# Patient Record
Sex: Female | Born: 1995 | Race: Black or African American | Hispanic: No | Marital: Single | Smoking: Never smoker
Health system: Southern US, Community
[De-identification: ages and names within clinical notes are randomized; demographics above are authoritative.]

## PROBLEM LIST (undated history)

## (undated) DIAGNOSIS — A64 Unspecified sexually transmitted disease: Secondary | ICD-10-CM

---

## 2016-02-13 DIAGNOSIS — A64 Unspecified sexually transmitted disease: Secondary | ICD-10-CM

## 2016-02-13 HISTORY — DX: Unspecified sexually transmitted disease: A64

## 2017-05-25 ENCOUNTER — Emergency Department (HOSPITAL_COMMUNITY)
Admission: EM | Admit: 2017-05-25 | Discharge: 2017-05-26 | Disposition: A | Discharge status: Home / Self Care | Payer: Self-pay | Attending: Emergency Medicine | Admitting: Emergency Medicine

## 2017-05-25 ENCOUNTER — Encounter (HOSPITAL_COMMUNITY): Payer: Self-pay | Admitting: Emergency Medicine

## 2017-05-25 DIAGNOSIS — Z331 Pregnant state, incidental: Secondary | ICD-10-CM | POA: Insufficient documentation

## 2017-05-25 DIAGNOSIS — N9489 Other specified conditions associated with female genital organs and menstrual cycle: Secondary | ICD-10-CM | POA: Insufficient documentation

## 2017-05-25 DIAGNOSIS — R112 Nausea with vomiting, unspecified: Secondary | ICD-10-CM | POA: Insufficient documentation

## 2017-05-25 HISTORY — DX: Unspecified sexually transmitted disease: A64

## 2017-05-25 LAB — CBC
HCT: 34.7 % — ABNORMAL LOW (ref 36.0–46.0)
Hemoglobin: 12.3 g/dL (ref 12.0–15.0)
MCH: 29.6 pg (ref 26.0–34.0)
MCHC: 35.4 g/dL (ref 30.0–36.0)
MCV: 83.6 fL (ref 78.0–100.0)
Platelets: 203 10*3/uL (ref 150–400)
RBC: 4.15 MIL/uL (ref 3.87–5.11)
RDW: 12.5 % (ref 11.5–15.5)
WBC: 6.8 10*3/uL (ref 4.0–10.5)

## 2017-05-25 LAB — POC URINE PREG, ED: Preg Test, Ur: POSITIVE — AB

## 2017-05-25 LAB — COMPREHENSIVE METABOLIC PANEL
ALBUMIN: 4.5 g/dL (ref 3.5–5.0)
ALK PHOS: 48 U/L (ref 38–126)
ALT: 15 U/L (ref 14–54)
AST: 33 U/L (ref 15–41)
Anion gap: 5 (ref 5–15)
BILIRUBIN TOTAL: 0.3 mg/dL (ref 0.3–1.2)
BUN: 10 mg/dL (ref 6–20)
CALCIUM: 9.4 mg/dL (ref 8.9–10.3)
CO2: 25 mmol/L (ref 22–32)
CREATININE: 0.54 mg/dL (ref 0.44–1.00)
Chloride: 103 mmol/L (ref 101–111)
GFR calc Af Amer: >60 mL/min (ref >60)
GFR calc non Af Amer: >60 mL/min (ref >60)
GLUCOSE: 88 mg/dL (ref 65–99)
Potassium: 3.8 mmol/L (ref 3.5–5.1)
Sodium: 133 mmol/L — ABNORMAL LOW (ref 135–145)
Total Protein: 8.7 g/dL — ABNORMAL HIGH (ref 6.5–8.1)

## 2017-05-25 LAB — URINALYSIS, ROUTINE W REFLEX MICROSCOPIC
Bilirubin Urine: NEGATIVE
GLUCOSE, UA: NEGATIVE mg/dL
Hgb urine dipstick: NEGATIVE
Ketones, ur: 80 mg/dL — AB
NITRITE: NEGATIVE
PH: 5 (ref 5.0–8.0)
PROTEIN: NEGATIVE mg/dL
Specific Gravity, Urine: 1.023 (ref 1.005–1.030)

## 2017-05-25 LAB — LIPASE, BLOOD: Lipase: 29 U/L (ref 11–51)

## 2017-05-25 MED ORDER — DIPHENHYDRAMINE HCL 50 MG/ML IJ SOLN
25.0000 mg | Freq: Once | INTRAMUSCULAR | Status: AC
  Administered 2017-05-25: 25 mg via INTRAVENOUS
  Filled 2017-05-25: qty 1

## 2017-05-25 MED ORDER — ONDANSETRON 4 MG PO TBDP: 4.0000 mg | ORAL_TABLET | Freq: Once | ORAL | Status: DC | PRN

## 2017-05-25 MED ORDER — SODIUM CHLORIDE 0.9 % IV BOLUS (SEPSIS)
1000.0000 mL | Freq: Once | INTRAVENOUS | Status: AC
  Administered 2017-05-25: 1000 mL via INTRAVENOUS

## 2017-05-25 MED ORDER — METOCLOPRAMIDE HCL 5 MG/ML IJ SOLN
10.0000 mg | Freq: Once | INTRAMUSCULAR | Status: AC
  Administered 2017-05-25: 10 mg via INTRAVENOUS

## 2017-05-25 MED ORDER — METOCLOPRAMIDE HCL 5 MG/ML IJ SOLN
INTRAMUSCULAR | Status: AC
  Administered 2017-05-25: 10 mg via INTRAVENOUS
  Filled 2017-05-25: qty 2

## 2017-05-25 NOTE — ED Provider Notes (Signed)
[facility] DE[facility] Provider Note: Lowella Dell, MD, FACEP  CSN: [id number] MRN: [id number] ARRIVAL: 05/25/17 at 2014 ROOM: WA06/WA06   CHIEF COMPLAINT  Vomiting   HISTORY OF PRESENT ILLNESS  Tonya Berger is a 21 y.o. female with a one-week history of intermittent headaches and nausea and vomiting. She states she becomes nauseated and vomits every time she eats. She took a pregnancy test about a week ago and it was negative. She has also had a "fluttering" feeling in her mid abdomen for the past week. She is here this evening because she had a moderate to severe headache at work. The headache has subsequently resolved on its own but she continues to be nauseated. It is noted that her pregnancy test here is positive. She had some vaginal spotting earlier in the week but no current vaginal bleeding or discharge.   Past Medical History:  Diagnosis Date  . STD (female) 02/2016   Chlamydia    History reviewed. No pertinent surgical history.  History reviewed. No pertinent family history.  Social History  Substance Use Topics  . Smoking status: Never Smoker  . Smokeless tobacco: Never Used  . Alcohol use No    Prior to Admission medications   Not on File    Allergies Patient has no known allergies.   REVIEW OF SYSTEMS  Negative except as noted here or in the History of Present Illness.   PHYSICAL EXAMINATION  Initial Vital Signs Blood pressure (!) 109/58, pulse 76, temperature 98.9 F (37.2 C), temperature source Oral, resp. rate 18, height 5\' 6"  (1.676 m), weight 60.3 kg (133 lb), last menstrual period 05/12/2017, SpO2 100 %.  Examination General: Well-developed, well-nourished female in no acute distress; appearance consistent with age of record HENT: normocephalic; atraumatic Eyes: pupils equal, round and reactive to light; extraocular muscles intact Neck: supple Heart: regular rate and rhythm Lungs: clear to auscultation bilaterally Abdomen: soft;  nondistended; nontender; no masses or hepatosplenomegaly; bowel sounds present Extremities: No deformity; full range of motion; pulses normal Neurologic: Awake, alert and oriented; motor function intact in all extremities and symmetric; no facial droop Skin: Warm and dry Psychiatric: Normal mood and affect   RESULTS  Summary of this visit's results, reviewed by myself:   EKG Interpretation  Date/Time:    Ventricular Rate:    PR Interval:    QRS Duration:   QT Interval:    QTC Calculation:   R Axis:     Text Interpretation:        Laboratory Studies: Results for orders placed or performed during the hospital encounter of 05/25/17 (from the past 24 hour(s))  Lipase, blood     Status: None   Collection Time: 05/25/17 10:43 PM  Result Value Ref Range   Lipase 29 11 - 51 U/L  Comprehensive metabolic panel     Status: Abnormal   Collection Time: 05/25/17 10:43 PM  Result Value Ref Range   Sodium 133 (L) 135 - 145 mmol/L   Potassium 3.8 3.5 - 5.1 mmol/L   Chloride 103 101 - 111 mmol/L   CO2 25 22 - 32 mmol/L   Glucose, Bld 88 65 - 99 mg/dL   BUN 10 6 - 20 mg/dL   Creatinine, Ser 7.82 0.44 - 1.00 mg/dL   Calcium 9.4 8.9 - 95.6 mg/dL   Total Protein 8.7 (H) 6.5 - 8.1 g/dL   Albumin 4.5 3.5 - 5.0 g/dL   AST 33 15 - 41 U/L   ALT 15 14 - 54  U/L   Alkaline Phosphatase 48 38 - 126 U/L   Total Bilirubin 0.3 0.3 - 1.2 mg/dL   GFR calc non Af Amer >60 >60 mL/min   GFR calc Af Amer >60 >60 mL/min   Anion gap 5 5 - 15  CBC     Status: Abnormal   Collection Time: 05/25/17 10:43 PM  Result Value Ref Range   WBC 6.8 4.0 - 10.5 K/uL   RBC 4.15 3.87 - 5.11 MIL/uL   Hemoglobin 12.3 12.0 - 15.0 g/dL   HCT 16.134.7 (L) 09.636.0 - 04.546.0 %   MCV 83.6 78.0 - 100.0 fL   MCH 29.6 26.0 - 34.0 pg   MCHC 35.4 30.0 - 36.0 g/dL   RDW 40.912.5 81.111.5 - 91.415.5 %   Platelets 203 150 - 400 K/uL  Urinalysis, Routine w reflex microscopic     Status: Abnormal   Collection Time: 05/25/17 10:43 PM  Result Value Ref  Range   Color, Urine YELLOW YELLOW   APPearance HAZY (A) CLEAR   Specific Gravity, Urine 1.023 1.005 - 1.030   pH 5.0 5.0 - 8.0   Glucose, UA NEGATIVE NEGATIVE mg/dL   Hgb urine dipstick NEGATIVE NEGATIVE   Bilirubin Urine NEGATIVE NEGATIVE   Ketones, ur 80 (A) NEGATIVE mg/dL   Protein, ur NEGATIVE NEGATIVE mg/dL   Nitrite NEGATIVE NEGATIVE   Leukocytes, UA SMALL (A) NEGATIVE   RBC / HPF 0-5 0 - 5 RBC/hpf   WBC, UA 0-5 0 - 5 WBC/hpf   Bacteria, UA RARE (A) NONE SEEN   Squamous Epithelial / LPF 0-5 (A) NONE SEEN   Mucous PRESENT    Hyaline Casts, UA PRESENT   POC Urine Pregnancy, ED (do NOT order at [facility])     Status: Abnormal   Collection Time: 05/25/17 11:00 PM  Result Value Ref Range   Preg Test, Ur POSITIVE (A) NEGATIVE  hCG, quantitative, pregnancy     Status: Abnormal   Collection Time: 05/25/17 11:36 PM  Result Value Ref Range   hCG, Beta Chain, Quant, S 23,040 (H) <5 mIU/mL   Imaging Studies: No results found.  ED COURSE  Nursing notes and initial vitals signs, including pulse oximetry, reviewed.  Vitals:   05/25/17 2042 05/25/17 2043  BP: (!) 109/58   Pulse: 76   Resp: 18   Temp: 98.9 F (37.2 C)   TempSrc: Oral   SpO2: 100%   Weight:  60.3 kg (133 lb)  Height:  5\' 6"  (1.676 m)   12:39 AM Patient feeling better after IV fluids and medications. [facility].  PROCEDURES    ED DIAGNOSES     ICD-10-CM   1. Incidental pregnancy Z33.1   2. Nausea and vomiting in adult R11.2        Nga Rabon, Jonny RuizJohn, MD 05/26/17 0040

## 2017-05-25 NOTE — ED Triage Notes (Signed)
Pt reports having vomiting and headaches for the last week. Pt reports taking pregnancy test last week and was negative. Pt reports last oral intake at 1430 and last vomiting at 1400 and 4 episodes of vomiting today.

## 2017-05-26 ENCOUNTER — Emergency Department (HOSPITAL_COMMUNITY): Payer: Self-pay

## 2017-05-26 ENCOUNTER — Emergency Department (HOSPITAL_COMMUNITY)
Admission: EM | Admit: 2017-05-26 | Discharge: 2017-05-26 | Disposition: A | Discharge status: Home / Self Care | Payer: Self-pay | Attending: Emergency Medicine | Admitting: Emergency Medicine

## 2017-05-26 ENCOUNTER — Encounter (HOSPITAL_COMMUNITY): Payer: Self-pay

## 2017-05-26 DIAGNOSIS — Z3490 Encounter for supervision of normal pregnancy, unspecified, unspecified trimester: Secondary | ICD-10-CM

## 2017-05-26 DIAGNOSIS — Z3A01 Less than 8 weeks gestation of pregnancy: Secondary | ICD-10-CM | POA: Insufficient documentation

## 2017-05-26 DIAGNOSIS — O209 Hemorrhage in early pregnancy, unspecified: Secondary | ICD-10-CM | POA: Insufficient documentation

## 2017-05-26 DIAGNOSIS — R102 Pelvic and perineal pain: Secondary | ICD-10-CM | POA: Insufficient documentation

## 2017-05-26 DIAGNOSIS — A599 Trichomoniasis, unspecified: Secondary | ICD-10-CM

## 2017-05-26 DIAGNOSIS — O4690 Antepartum hemorrhage, unspecified, unspecified trimester: Secondary | ICD-10-CM

## 2017-05-26 DIAGNOSIS — O23591 Infection of other part of genital tract in pregnancy, first trimester: Secondary | ICD-10-CM | POA: Insufficient documentation

## 2017-05-26 LAB — WET PREP, GENITAL
Clue Cells Wet Prep HPF POC: NONE SEEN
SPERM: NONE SEEN
Yeast Wet Prep HPF POC: NONE SEEN

## 2017-05-26 LAB — HCG, QUANTITATIVE, PREGNANCY
hCG, Beta Chain, Quant, S: 19761 m[IU]/mL — ABNORMAL HIGH (ref <5)
hCG, Beta Chain, Quant, S: 23040 m[IU]/mL — ABNORMAL HIGH (ref <5)

## 2017-05-26 LAB — ABO/RH: ABO/RH(D): B POS

## 2017-05-26 LAB — RAPID HIV SCREEN (HIV 1/2 AB+AG)
HIV 1/2 ANTIBODIES: NONREACTIVE
HIV-1 P24 ANTIGEN - HIV24: NONREACTIVE

## 2017-05-26 MED ORDER — METRONIDAZOLE 500 MG PO TABS: 500.0000 mg | ORAL_TABLET | Freq: Two times a day (BID) | ORAL | 0 refills | Status: AC

## 2017-05-26 MED ORDER — CEFTRIAXONE SODIUM 250 MG IJ SOLR
250.0000 mg | Freq: Once | INTRAMUSCULAR | Status: AC
  Administered 2017-05-26: 250 mg via INTRAMUSCULAR
  Filled 2017-05-26: qty 250

## 2017-05-26 MED ORDER — AZITHROMYCIN 1 G PO PACK
1.0000 g | PACK | Freq: Once | ORAL | Status: AC
  Administered 2017-05-26: 1 g via ORAL
  Filled 2017-05-26: qty 1

## 2017-05-26 MED ORDER — PRENATAL VITAMINS 28-0.8 MG PO TABS: 1.0000 | ORAL_TABLET | Freq: Every day | ORAL | Status: AC

## 2017-05-26 MED ORDER — METOCLOPRAMIDE HCL 10 MG PO TABS: 10.0000 mg | ORAL_TABLET | Freq: Four times a day (QID) | ORAL | 0 refills | Status: AC | PRN

## 2017-05-26 MED ORDER — LIDOCAINE HCL 1 % IJ SOLN
INTRAMUSCULAR | Status: AC
  Administered 2017-05-26: 20 mL
  Filled 2017-05-26: qty 20

## 2017-05-26 NOTE — ED Provider Notes (Signed)
[facility] Provider Note   CSN: [id number] Arrival date & time: 05/26/17  1521     History   Chief Complaint Chief Complaint  Patient presents with  . Routine Prenatal Visit  . Vaginal Bleeding    HPI Tonya Berger is a 21 y.o. female G1 P0 presenting with sudden onset of vaginal bleeding and pelvic cramping intermittently. This started today. She reports that she was here yesterday for complaint of headache nausea vomiting and there was an incidental finding of pregnancy. She denies any cramping or bleeding yesterday. She states that she has used one single pad all day. She reports having small clots but not heavy menstrual type bleeding. The cramping is across her lower abdomen and achy in nature like menstrual cramping no focal sharp pain. Denies fever, chills or other symptoms.  HPI  Past Medical History:  Diagnosis Date  . STD (female) 02/2016   Chlamydia    There are no active problems to display for this patient.   History reviewed. No pertinent surgical history.  OB History    No data available       Home Medications    Prior to Admission medications   Medication Sig Start Date End Date Taking? Authorizing Provider  metoCLOPramide (REGLAN) 10 MG tablet Take 1 tablet (10 mg total) by mouth every 6 (six) hours as needed for nausea (nausea/headache). 05/26/17   Molpus, John, MD  Prenatal Vit-Fe Fumarate-FA (PRENATAL VITAMINS) 28-0.8 MG TABS Take 1 tablet by mouth daily. 05/26/17   Molpus, Jonny Ruiz, MD    Family History History reviewed. No pertinent family history.  Social History Social History  Substance Use Topics  . Smoking status: Never Smoker  . Smokeless tobacco: Never Used  . Alcohol use No     Allergies   Patient has no known allergies.   Review of Systems Review of Systems  Constitutional: Negative for chills and fever.  Respiratory: Negative for cough, chest tightness, shortness of breath, wheezing and stridor.   Cardiovascular:  Negative for chest pain, palpitations and leg swelling.  Gastrointestinal: Negative for abdominal distention, abdominal pain, diarrhea, nausea and vomiting.  Genitourinary: Positive for pelvic pain, vaginal bleeding and vaginal discharge. Negative for difficulty urinating, dysuria, flank pain, frequency, hematuria and vaginal pain.  Musculoskeletal: Negative for arthralgias, back pain, gait problem and myalgias.  Skin: Negative for color change, pallor and rash.  Neurological: Negative for dizziness, seizures, syncope, weakness, light-headedness, numbness and headaches.     Physical Exam Updated Vital Signs BP 127/88 (BP Location: Right Arm)   Pulse 88   Temp 98.4 F (36.9 C) (Oral)   Resp 16   LMP  (LMP Unknown)   SpO2 100%   Physical Exam  Constitutional: She is oriented to person, place, and time. She appears well-developed and well-nourished. No distress.  Afebrile, nontoxic-appearing, lying comfortably in bed in no acute distress.  HENT:  Head: Normocephalic and atraumatic.  Eyes: EOM are normal.  Neck: Normal range of motion.  Cardiovascular: Normal rate, regular rhythm, normal heart sounds and intact distal pulses.   No murmur heard. Pulmonary/Chest: Effort normal and breath sounds normal. No respiratory distress. She has no wheezes. She has no rales.  Abdominal: Soft. She exhibits no distension and no mass. There is no tenderness. There is no rebound and no guarding.  Patient denies any cramping or abdominal pain at this time. Abdomen is soft and nontender to palpation.  Genitourinary: Vagina normal.  Genitourinary Comments: The os appears closed and dark blood and  discharge noted in the vaginal vault. No clear POC visualized. cervix appears friable. No cervical motion tenderness or adnexal mass or tenderness.   Musculoskeletal: She exhibits no edema.  Neurological: She is alert and oriented to person, place, and time.  Skin: Skin is warm and dry. No rash noted. She is not  diaphoretic. No erythema. No pallor.  Psychiatric: She has a normal mood and affect.  Nursing note and vitals reviewed.    ED Treatments / Results  Labs (all labs ordered are listed, but only abnormal results are displayed) Labs Reviewed  WET PREP, GENITAL - Abnormal; Notable for the following:       Result Value   Trich, Wet Prep PRESENT (*)    WBC, Wet Prep HPF POC FEW (*)    All other components within normal limits  HCG, QUANTITATIVE, PREGNANCY  HIV ANTIBODY (ROUTINE TESTING)  RAPID HIV SCREEN (HIV 1/2 AB+AG)  ABO/RH  GC/CHLAMYDIA PROBE AMP ([place]) NOT AT [facility]    EKG  EKG Interpretation None       Radiology [place] Ob Comp Less 14 Wks  Result Date: 05/26/2017 CLINICAL DATA:  One week of vaginal bleeding EXAM: OBSTETRIC <14 WK US AND TRANSVAGINAL OB US TECHNIQUE: Both transabdominal and transvaginal ultrasound examinations were performed for complete evaluation of the gestation as well as the maternal uterus, adnexal regions, and pelvic cul-de-sac. Transvaginal technique was performed to assess early pregnancy. COMPARISON:  None in PACs FINDINGS: Intrauterine gestational sac: Single Yolk sac:  Present Embryo:  Present Cardiac Activity: Present Heart Rate: 112  bpm CRL:  3  mm   5 w   6 d                  US EDC: January 20, 2018 Subchorionic hemorrhage:  None visualized. Maternal uterus/adnexae: There is a 1.4 cm parovarian cyst on the right. The left ovary exhibits a similar size cystic area but visualization of the left ovary is limited. There is a small amount of free pelvic fluid. IMPRESSION: There is a viable IUP with estimated gestational age of [redacted] weeks 6 days and estimated date of confinement of January 20, 2018. No subchorionic hemorrhage is observed. Electronically Signed   By: David  [place] M.D.   On: 05/26/2017 17:08   [place] Ob Transvaginal  Result Date: 05/26/2017 CLINICAL DATA:  One week of vaginal bleeding EXAM: OBSTETRIC <14 WK US AND TRANSVAGINAL OB US TECHNIQUE:  Both transabdominal and transvaginal ultrasound examinations were performed for complete evaluation of the gestation as well as the maternal uterus, adnexal regions, and pelvic cul-de-sac. Transvaginal technique was performed to assess early pregnancy. COMPARISON:  None in PACs FINDINGS: Intrauterine gestational sac: Single Yolk sac:  Present Embryo:  Present Cardiac Activity: Present Heart Rate: 112  bpm CRL:  3  mm   5 w   6 d                  US EDC: January 20, 2018 Subchorionic hemorrhage:  None visualized. Maternal uterus/adnexae: There is a 1.4 cm parovarian cyst on the right. The left ovary exhibits a similar size cystic area but visualization of the left ovary is limited. There is a small amount of free pelvic fluid. IMPRESSION: There is a viable IUP with estimated gestational age of [redacted] weeks 6 days and estimated date of confinement of January 20, 2018. No subchorionic hemorrhage is observed. Electronically Signed   By: David  [place] M.D.   On: 05/26/2017 17:08  Procedures Procedures (including critical care time)  Medications Ordered in ED Medications - No data to display   Initial Impression / Assessment and Plan / ED Course  I have reviewed the triage vital signs and the nursing notes.  Pertinent labs & imaging results that were available during my care of the patient were reviewed by me and considered in my medical decision making (see chart for details).    Patient presents with vaginal spotting and intermittent cramping since this morning. Pregnancy was found incidentally yesterday while working up different complaint. Plan was for her to follow-up with OB/GYN today but she started having cramping and bleeding.  Ordered Quant and ABO Rh, pelvic exam revealed a closed os and small amount of blood in the vaginal vault. Patient is Rh positive, not a rhogam candidate. Positive for trichomonas on wet prep  Ultrasound with evidence of viable intrauterine pregnancy. EGA at 5 weeks 6  days.  Patient has been comfortable and stable during her stay in the emergency department. Patient was treated for STIs in ED prior to discharge. Discharge home with pelvic rest, tx for trich and close OB/GYN follow-up.  Discussed strict return precautions and advised to return to the emergency department if experiencing any new or worsening symptoms. Instructions were understood and patient agreed with discharge plan.  Final Clinical Impressions(s) / ED Diagnoses   Final diagnoses:  Vaginal bleeding in pregnancy    New Prescriptions New Prescriptions   No medications on file     Gregary Cromer 05/26/17 2057    Rolan Bucco, MD 05/27/17 0002

## 2017-05-26 NOTE — ED Notes (Signed)
Pt states she was seen at the ED yesterday and found out she was pregnant. Today she has had bleeding. Pt states the blood is bright red and less than 1 pads worth, but it is also there when she urinates

## 2017-05-26 NOTE — ED Triage Notes (Signed)
Pt in yesterday for n/v.  States she was told she was pregnant.  Pt states she had spotting in past week before yesterday and thought it was menstrual related.  Bleeding had gone away yesterday.  Today bleeding has returned with abdominal cramping.

## 2017-05-26 NOTE — Discharge Instructions (Signed)
As discussed, please follow-up with the women's clinic tomorrow morning.  Return to the emergency department if you experience worsening pain, bleeding, lightheadedness, dizziness or any other new concerning symptoms in the meantime.

## 2017-05-27 LAB — GC/CHLAMYDIA PROBE AMP (~~LOC~~) NOT AT ARMC
Chlamydia: POSITIVE — AB
NEISSERIA GONORRHEA: NEGATIVE

## 2017-05-27 LAB — HIV ANTIBODY (ROUTINE TESTING W REFLEX): HIV SCREEN 4TH GENERATION: NONREACTIVE

## 2019-05-24 IMAGING — US US OB COMP LESS 14 WK
1 series · 14 of 28 positions shown · non-contrast
Comparison: None in PACs

CLINICAL DATA: One week of vaginal bleeding

EXAM:
OBSTETRIC <14 WK US AND TRANSVAGINAL OB US
TECHNIQUE: Both transabdominal and transvaginal ultrasound examinations were
performed for complete evaluation of the gestation as well as the
maternal uterus, adnexal regions, and pelvic cul-de-sac.
Transvaginal technique was performed to assess early pregnancy.

[Series 1: us ob comp less 14 wk · 0.20mm/px · 14 of 86 slices shown]
[im 4/86]
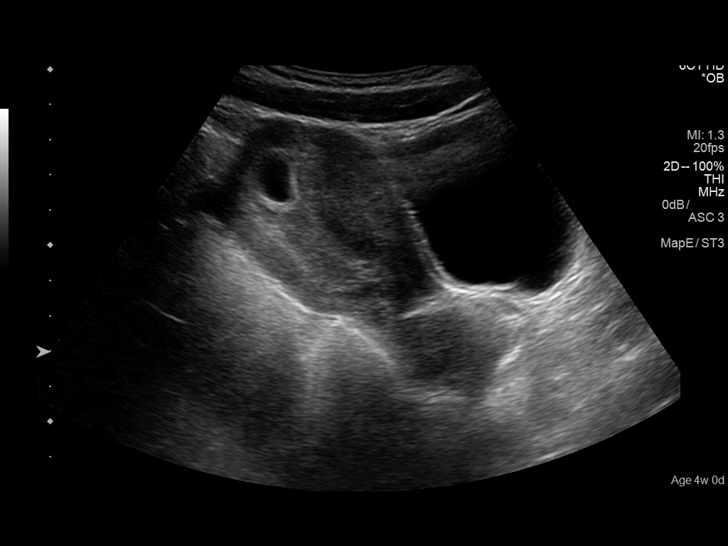
[im 10/86]
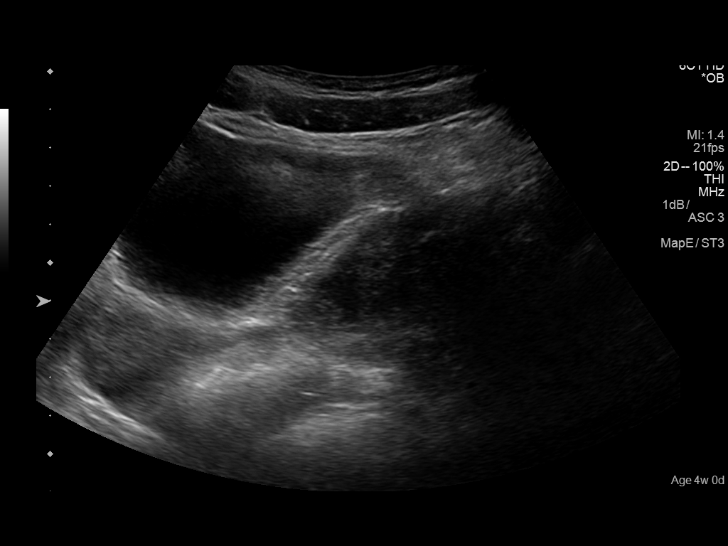
[im 16/86]
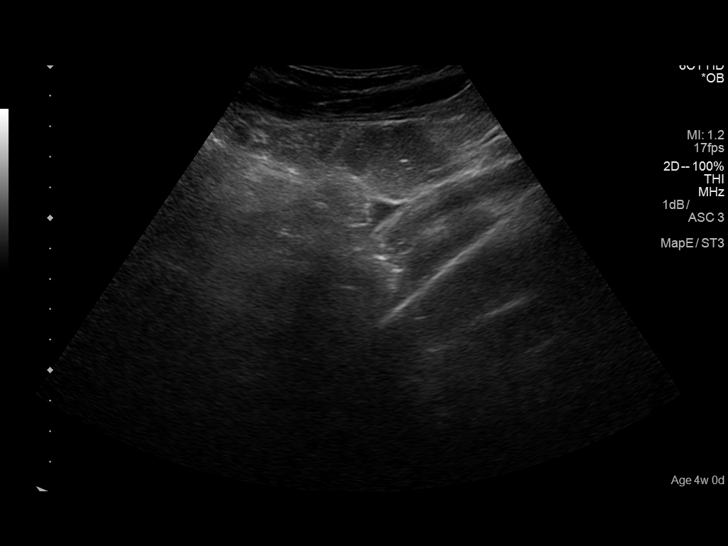
[im 23/86]
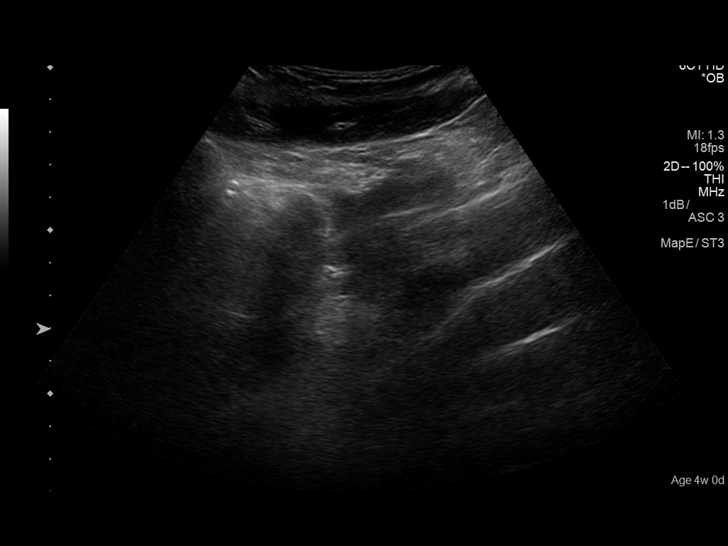
[im 29/86]
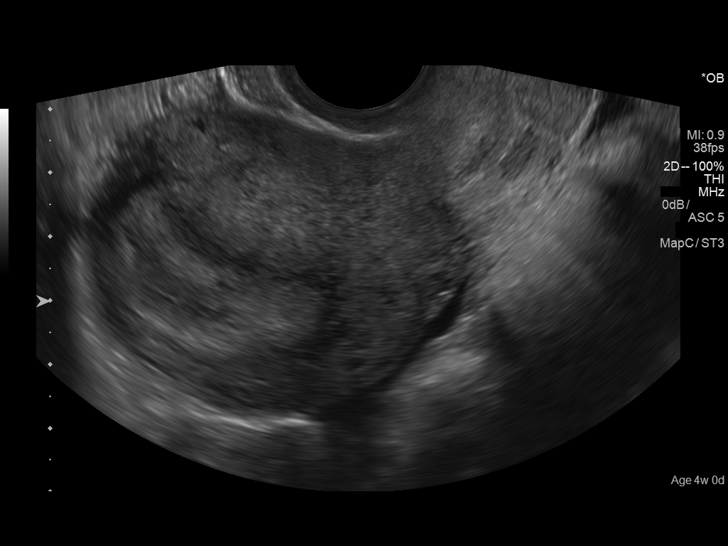
[im 35/86]
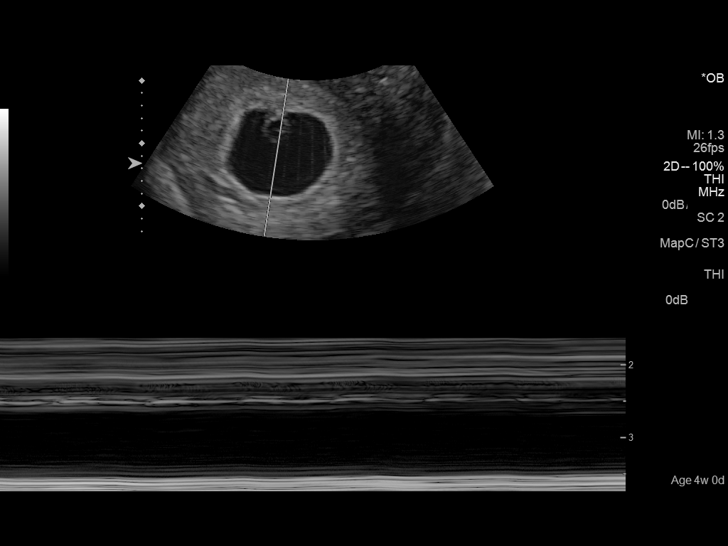
[im 41/86]
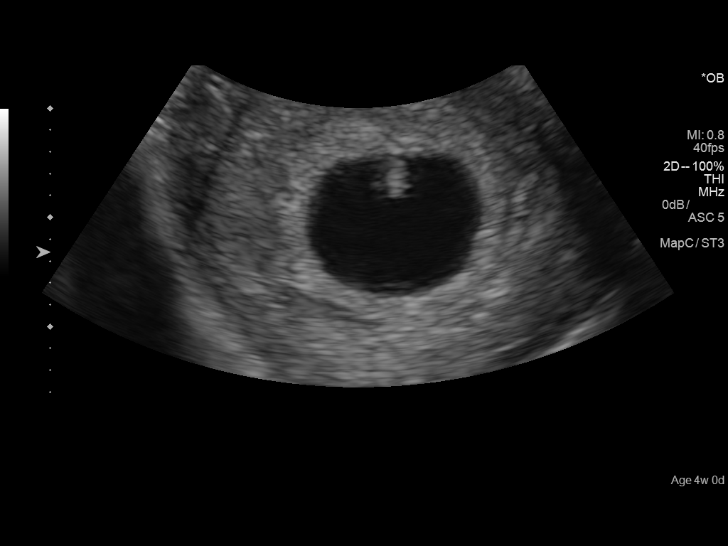
[im 48/86]
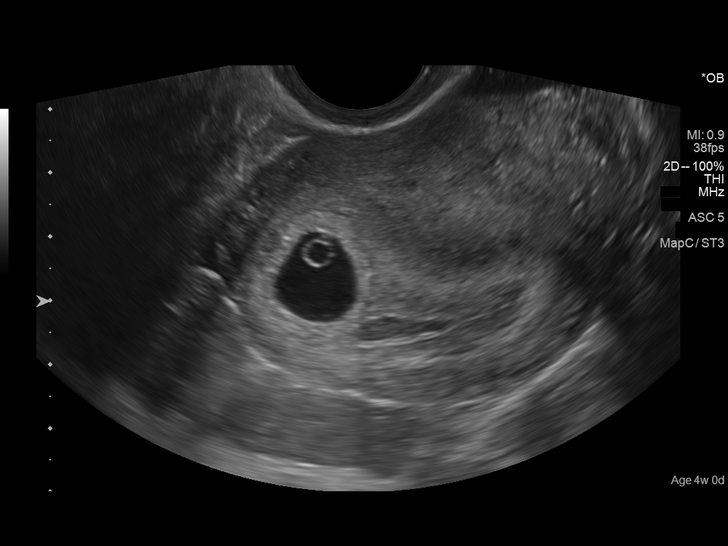
[im 54/86]
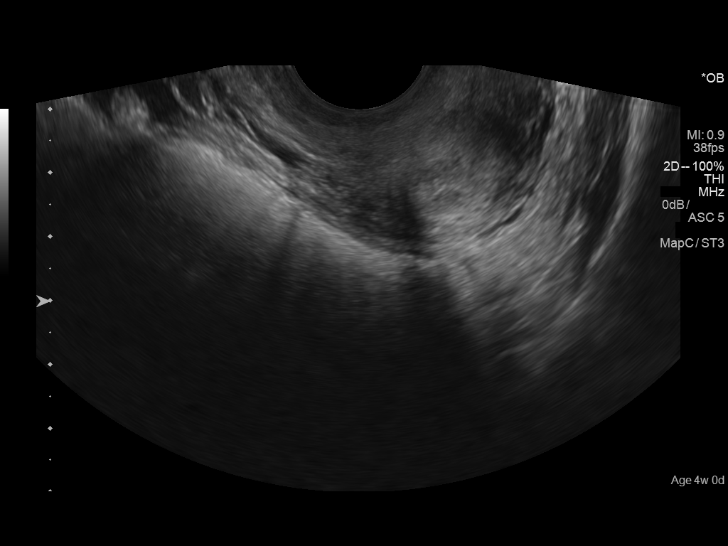
[im 60/86]
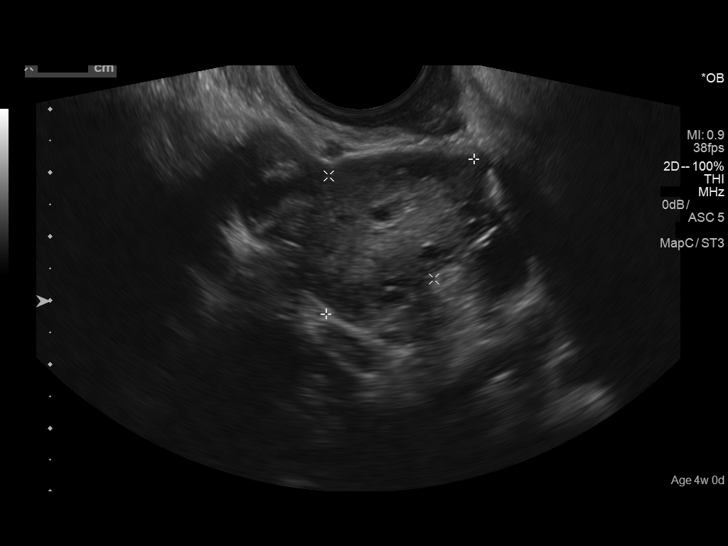
[im 67/86]
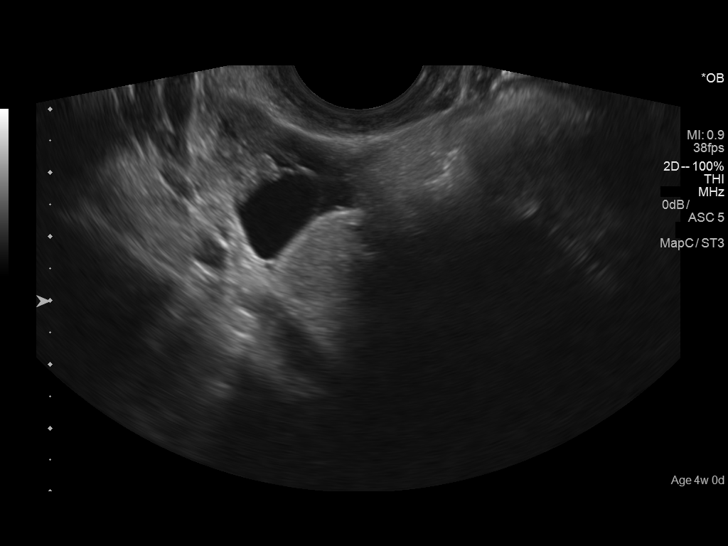
[im 73/86]
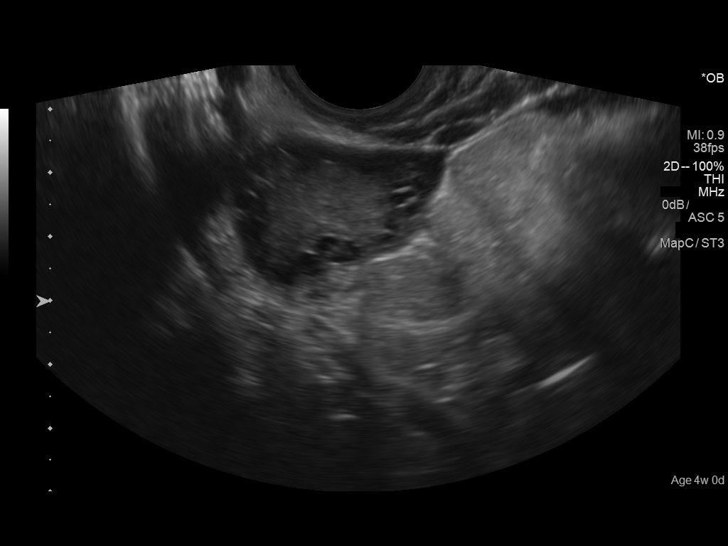
[im 79/86]
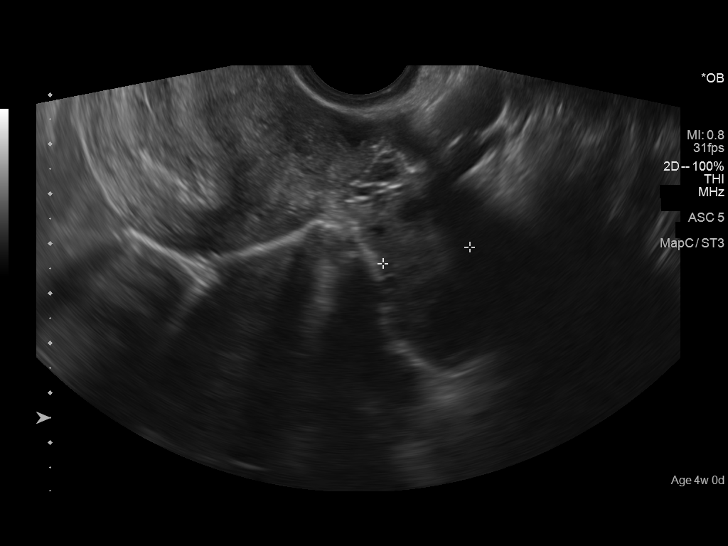
[im 86/86]
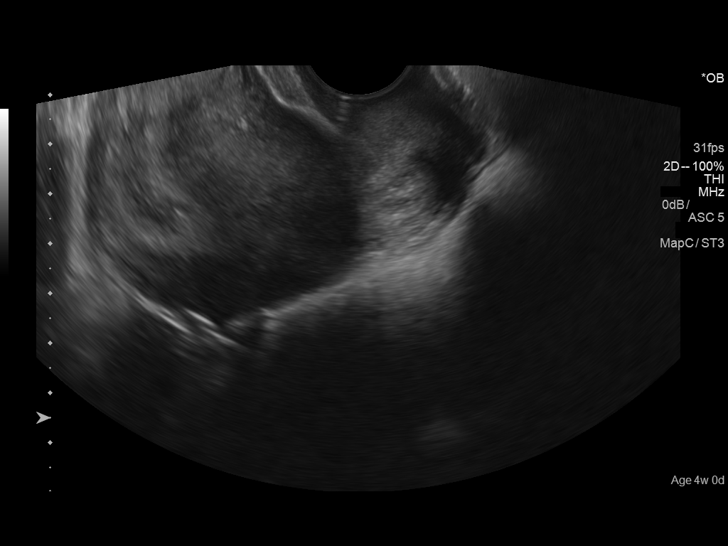

[14 of 28 positions shown; findings below may reference images not displayed]

FINDINGS: Intrauterine gestational sac: Single

Yolk sac:  Present

Embryo:  Present

Cardiac Activity: Present

Heart Rate: 112  bpm

CRL:  3  mm   5 w   6 d                  US EDC: January 20, 2018

Subchorionic hemorrhage:  None visualized.

Maternal uterus/adnexae: There is a 1.4 cm parovarian cyst on the
right. The left ovary exhibits a similar size cystic area but
visualization of the left ovary is limited. There is a small amount
of free pelvic fluid.
IMPRESSION: There is a viable IUP with estimated gestational age of 5 weeks 6
days and estimated date of confinement January 20, 2018. No
subchorionic hemorrhage is observed.
